# Patient Record
Sex: Male | Born: 2000 | Race: White | Hispanic: No | Marital: Single | Smoking: Never smoker
Health system: Southern US, Community
[De-identification: ages and names within clinical notes are randomized; demographics above are authoritative.]

## PROBLEM LIST (undated history)

## (undated) DIAGNOSIS — J302 Other seasonal allergic rhinitis: Secondary | ICD-10-CM

## (undated) DIAGNOSIS — R479 Unspecified speech disturbances: Secondary | ICD-10-CM

## (undated) DIAGNOSIS — J45909 Unspecified asthma, uncomplicated: Secondary | ICD-10-CM

## (undated) DIAGNOSIS — F840 Autistic disorder: Secondary | ICD-10-CM

## (undated) DIAGNOSIS — F909 Attention-deficit hyperactivity disorder, unspecified type: Secondary | ICD-10-CM

## (undated) DIAGNOSIS — F952 Tourette's disorder: Secondary | ICD-10-CM

## (undated) DIAGNOSIS — R6250 Unspecified lack of expected normal physiological development in childhood: Secondary | ICD-10-CM

## (undated) DIAGNOSIS — F959 Tic disorder, unspecified: Secondary | ICD-10-CM

## (undated) DIAGNOSIS — L309 Dermatitis, unspecified: Secondary | ICD-10-CM

## (undated) HISTORY — DX: Tic disorder, unspecified: F95.9

## (undated) HISTORY — PX: CIRCUMCISION: SHX1350

## (undated) HISTORY — DX: Attention-deficit hyperactivity disorder, unspecified type: F90.9

## (undated) HISTORY — DX: Unspecified lack of expected normal physiological development in childhood: R62.50

## (undated) HISTORY — DX: Unspecified speech disturbances: R47.9

---
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
## 2022-11-13 NOTE — Progress Notes (Signed)
Reviewed pt's medical history, height and weight (>50kg guideline) and last Neuro notes with Dr Chaney Malling. Pt's upcoming surgery will need to be moved to the main OR. Katie at Dr Sol Blazing office notified.

## 2022-11-23 ENCOUNTER — Ambulatory Visit (HOSPITAL_BASED_OUTPATIENT_CLINIC_OR_DEPARTMENT_OTHER): Admission: RE | Admit: 2022-11-23 | Payer: MEDICAID | Source: Home / Self Care | Admitting: Dentistry

## 2022-11-23 ENCOUNTER — Encounter (HOSPITAL_BASED_OUTPATIENT_CLINIC_OR_DEPARTMENT_OTHER): Admission: RE | Payer: Self-pay | Source: Home / Self Care

## 2022-11-23 SURGERY — DENTAL RESTORATION/EXTRACTION WITH X-RAY
Anesthesia: General
